# Patient Record
Sex: Male | Born: 1990 | Hispanic: No | Marital: Single | Smoking: Current every day smoker
Health system: Southern US, Community
[De-identification: ages and names within clinical notes are randomized; demographics above are authoritative.]

---

## 2016-09-19 ENCOUNTER — Encounter (HOSPITAL_COMMUNITY): Payer: Self-pay

## 2016-09-19 ENCOUNTER — Emergency Department (HOSPITAL_COMMUNITY)
Admission: EM | Admit: 2016-09-19 | Discharge: 2016-09-19 | Disposition: A | Discharge status: Home / Self Care | Payer: Self-pay | Attending: Emergency Medicine | Admitting: Emergency Medicine

## 2016-09-19 DIAGNOSIS — R0981 Nasal congestion: Secondary | ICD-10-CM | POA: Insufficient documentation

## 2016-09-19 DIAGNOSIS — R059 Cough, unspecified: Secondary | ICD-10-CM

## 2016-09-19 DIAGNOSIS — Z79899 Other long term (current) drug therapy: Secondary | ICD-10-CM | POA: Insufficient documentation

## 2016-09-19 DIAGNOSIS — R05 Cough: Secondary | ICD-10-CM | POA: Insufficient documentation

## 2016-09-19 DIAGNOSIS — F17200 Nicotine dependence, unspecified, uncomplicated: Secondary | ICD-10-CM | POA: Insufficient documentation

## 2016-09-19 MED ORDER — BENZONATATE 100 MG PO CAPS: 100.0000 mg | ORAL_CAPSULE | Freq: Three times a day (TID) | ORAL | 0 refills | Status: AC | PRN

## 2016-09-19 MED ORDER — FLUTICASONE PROPIONATE 50 MCG/ACT NA SUSP: 1.0000 | Freq: Every day | NASAL | 0 refills | Status: AC

## 2016-09-19 NOTE — Discharge Instructions (Signed)
Take Tessalon as needed for cough. Flonase spray for congestion. Continue taking Claritin. Take ibuprofen or Tylenol as needed. Follow up with primary care if symptoms persist. Return to the ED if concerning symptoms develop.

## 2016-09-19 NOTE — ED Notes (Signed)
Pt audibly wheezing. Reported to PA.

## 2016-09-19 NOTE — ED Triage Notes (Signed)
Patient complains of congestion, cold symptoms and sinus pressure x 1 week, NAD

## 2016-09-19 NOTE — ED Provider Notes (Signed)
[facility]-EMERGENCY DEPT Provider Note   CSN: [id number] Arrival date & time: 09/19/16  1002  By signing my name below, I, Linna DarnerRussell Turner, attest that this documentation has been prepared under the direction and in the presence of AetnaMina Iker Nuttall, PA-C. Electronically Signed: Linna Darnerussell Turner, Scribe. 09/19/16. 12:03 PM  History   Chief Complaint Chief Complaint  Patient presents with  . Nasal Congestion   The history is provided by the patient. No language interpreter was used.   HPI Comments: Kurt Moore is a 26 y.o. male who presents to the Emergency Department complaining of constant, unchanged nasal congestion for 4 days. He reports associated sinus pressure, rhinorrhea, and an intermittent productive cough with clear sputum. He has tried Tylenol, Benadryl, Claritin, and Sudafed with some improvement of his symptoms. Patient notes his symptoms are consistent with his h/o seasonal allergies. He denies ear pain, sore throat, chest pain, SOB, abdominal pain, nausea, vomiting, diarrhea, headaches, fevers, chills, or any other associated symptoms.  History reviewed. No pertinent past medical history.  There are no active problems to display for this patient.   History reviewed. No pertinent surgical history.     Home Medications    Prior to Admission medications   Medication Sig Start Date End Date Taking? Authorizing Provider  benzonatate (TESSALON) 100 MG capsule Take 1 capsule (100 mg total) by mouth 3 (three) times daily as needed for cough. 09/19/16   Shafer Swamy A, PA-C  fluticasone (FLONASE) 50 MCG/ACT nasal spray Place 1 spray into both nostrils daily. 09/19/16   Jeanie SewerFawze, Taffie Eckmann A, PA-C    Family History No family history on file.  Social History Social History  Substance Use Topics  . Smoking status: Current Every Day Smoker  . Smokeless tobacco: Never Used  . Alcohol use Not on file     Allergies   Penicillins   Review of Systems Review of Systems  Constitutional:  Negative for chills and fever.  HENT: Positive for congestion, rhinorrhea and sinus pressure. Negative for ear pain and sore throat.   Respiratory: Positive for cough. Negative for shortness of breath.   Cardiovascular: Negative for chest pain.  Gastrointestinal: Negative for abdominal pain, diarrhea, nausea and vomiting.  Genitourinary: Negative for dysuria and hematuria.  Neurological: Negative for headaches.   Physical Exam Updated Vital Signs BP 140/90 (BP Location: Right Arm)   Pulse 91   Temp 98.9 F (37.2 C)   Resp 20   SpO2 95%   Physical Exam  Constitutional: He is oriented to person, place, and time. He appears well-developed and well-nourished.  HENT:  Head: Normocephalic.  Right Ear: External ear normal.  Left Ear: External ear normal.  Mouth/Throat: Oropharynx is clear and moist.  no TTP of frontal or maxillary sinuses. TMs normal bilaterally. Nasal septum is midline with pale pink mucosa and clear discharge. Posterior pharynx is normal without erythema, tonsillar hypertrophy, exudates, uvular deviation. Post-nasal drip noted.   Eyes: Conjunctivae are normal. Right eye exhibits no discharge. Left eye exhibits no discharge. No scleral icterus.  Neck: Normal range of motion. Neck supple. No JVD present. No tracheal deviation present. No thyromegaly present.  Cardiovascular: Normal rate, regular rhythm, normal heart sounds and intact distal pulses.   Pulmonary/Chest: Effort normal and breath sounds normal. No respiratory distress. He has no wheezes. He exhibits no tenderness.  Abdominal: Soft. Bowel sounds are normal. He exhibits no distension. There is no tenderness.  Musculoskeletal: Normal range of motion.  Moves extremities spontaneously  Lymphadenopathy:    He  has no cervical adenopathy.  Neurological: He is alert and oriented to person, place, and time.  Fluent speech, no facial droop, normal gait  Skin: Skin is warm and dry. Capillary refill takes less than 2  seconds.  Psychiatric: He has a normal mood and affect. His behavior is normal.  Nursing note and vitals reviewed.  ED Treatments / Results  Labs (all labs ordered are listed, but only abnormal results are displayed) Labs Reviewed - No data to display  EKG  EKG Interpretation None       Radiology No results found.  Procedures Procedures (including critical care time)  DIAGNOSTIC STUDIES: Oxygen Saturation is 97% on RA, normal by my interpretation.    COORDINATION OF CARE: 12:08 PM Discussed treatment plan with pt at bedside and pt agreed to plan.  Medications Ordered in ED Medications - No data to display   Initial Impression / Assessment and Plan / ED Course  I have reviewed the triage vital signs and the nursing notes.  Pertinent labs & imaging results that were available during my care of the patient were reviewed by me and considered in my medical decision making (see chart for details).  Patient with 4 day history of nasal congestion and cough. Afebrile, vital signs stable. No audible wheezing or other adventitious sounds on auscultation of lungs. Low suspicion of pneumonia, bronchitis or other cardiopulmonary abnormality. Physical exam consistent with URI or seasonal allergies. Rx for Tessalon for cough given as well as Flonase for nasal congestion. Encourage patient to continue use of Claritin for symptomatic treatment as it has been working. Recurrent follow-up with primary care in the next few days for reevaluation. Discussed strict ED return precautions. Pt verbalized understanding of and agreement with plan and is safe for discharge home at this time.     Final Clinical Impressions(s) / ED Diagnoses   Final diagnoses:  Cough  Nasal congestion    New Prescriptions Discharge Medication List as of 09/19/2016 12:12 PM    START taking these medications   Details  benzonatate (TESSALON) 100 MG capsule Take 1 capsule (100 mg total) by mouth 3 (three) times daily  as needed for cough., Starting Sat 09/19/2016, Print    fluticasone (FLONASE) 50 MCG/ACT nasal spray Place 1 spray into both nostrils daily., Starting Sat 09/19/2016, Print       I personally performed the services described in this documentation, which was scribed in my presence. The recorded information has been reviewed and is accurate.    Jeanie Sewer, PA-C 09/20/16 1940    Arby Barrette, MD 09/21/16 [phone]

## 2016-09-19 NOTE — ED Notes (Signed)
Pt walked in hallway- pulse ox ranged from 100% to 93%.

## 2016-10-15 ENCOUNTER — Encounter (HOSPITAL_COMMUNITY): Payer: Self-pay

## 2016-10-15 ENCOUNTER — Emergency Department (HOSPITAL_COMMUNITY)
Admission: EM | Admit: 2016-10-15 | Discharge: 2016-10-15 | Disposition: A | Discharge status: Home / Self Care | Payer: Self-pay | Attending: Dermatology | Admitting: Dermatology

## 2016-10-15 DIAGNOSIS — Z5321 Procedure and treatment not carried out due to patient leaving prior to being seen by health care provider: Secondary | ICD-10-CM | POA: Insufficient documentation

## 2016-10-15 DIAGNOSIS — F17210 Nicotine dependence, cigarettes, uncomplicated: Secondary | ICD-10-CM | POA: Insufficient documentation

## 2016-10-15 DIAGNOSIS — Z79899 Other long term (current) drug therapy: Secondary | ICD-10-CM | POA: Insufficient documentation

## 2016-10-15 DIAGNOSIS — M25522 Pain in left elbow: Secondary | ICD-10-CM | POA: Insufficient documentation

## 2016-10-15 NOTE — ED Triage Notes (Signed)
Pt states that he was playing basketball on Sun and was hit in his L elbow, full ROM, no swelling noted

## 2016-10-16 ENCOUNTER — Encounter (HOSPITAL_COMMUNITY): Payer: Self-pay

## 2016-10-16 ENCOUNTER — Emergency Department (HOSPITAL_COMMUNITY): Payer: Self-pay

## 2016-10-16 ENCOUNTER — Emergency Department (HOSPITAL_COMMUNITY)
Admission: EM | Admit: 2016-10-16 | Discharge: 2016-10-16 | Disposition: A | Discharge status: Home / Self Care | Payer: Self-pay | Attending: Emergency Medicine | Admitting: Emergency Medicine

## 2016-10-16 DIAGNOSIS — Z79899 Other long term (current) drug therapy: Secondary | ICD-10-CM | POA: Insufficient documentation

## 2016-10-16 DIAGNOSIS — Y998 Other external cause status: Secondary | ICD-10-CM | POA: Insufficient documentation

## 2016-10-16 DIAGNOSIS — Y929 Unspecified place or not applicable: Secondary | ICD-10-CM | POA: Insufficient documentation

## 2016-10-16 DIAGNOSIS — S59902A Unspecified injury of left elbow, initial encounter: Secondary | ICD-10-CM | POA: Insufficient documentation

## 2016-10-16 DIAGNOSIS — F17200 Nicotine dependence, unspecified, uncomplicated: Secondary | ICD-10-CM | POA: Insufficient documentation

## 2016-10-16 DIAGNOSIS — X509XXA Other and unspecified overexertion or strenuous movements or postures, initial encounter: Secondary | ICD-10-CM | POA: Insufficient documentation

## 2016-10-16 DIAGNOSIS — Y9367 Activity, basketball: Secondary | ICD-10-CM | POA: Insufficient documentation

## 2016-10-16 MED ORDER — HYDROCODONE-ACETAMINOPHEN 5-325 MG PO TABS
1.0000 | ORAL_TABLET | Freq: Once | ORAL | Status: AC
  Administered 2016-10-16: 1 via ORAL
  Filled 2016-10-16: qty 1

## 2016-10-16 MED ORDER — NAPROXEN 500 MG PO TABS: 500.0000 mg | ORAL_TABLET | Freq: Two times a day (BID) | ORAL | 0 refills | Status: AC

## 2016-10-16 NOTE — ED Notes (Signed)
Pt verbalized understanding of d/c instructions and has no further questions. Pt stable and NAD. VSS.  

## 2016-10-16 NOTE — ED Provider Notes (Signed)
[facility]PT Provider Note   CSN: [id number] Arrival date & time: 10/16/16  0127  By signing my name below, I, Elder NegusRussell Johnston, attest that this documentation has been prepared under the direction and in the presence of Shania Bjelland, Canary Brimhristopher J, MD. Electronically Signed: Elder Negusussell Johnston, Scribe. 10/16/16. 1:57 AM.   History   Chief Complaint Chief Complaint  Patient presents with  . Elbow Pain    HPI Kurt Moore is a 26 y.o. male without chronic medical problems who presents to the ED following a fall. The patient states he was playing basketball 4 days ago when he fell. Landed on his outstretched L hand. Since then he has experienced constant pain over the L distal, posterior upper arm. He is able to range the L elbow. He presents tonight because he continues to experience pain. No changes in sensation or strength distally. Denies any other injuries following his fall.  The history is provided by the patient. No language interpreter was used.  Arm Injury   This is a new problem. The current episode started more than 2 days ago. The problem occurs constantly. The problem has not changed since onset.The pain is present in the left elbow. The pain is moderate. Pertinent negatives include no numbness, full range of motion and no tingling. He has tried nothing for the symptoms.    History reviewed. No pertinent past medical history.  There are no active problems to display for this patient.   History reviewed. No pertinent surgical history.     Home Medications    Prior to Admission medications   Medication Sig Start Date End Date Taking? Authorizing Provider  benzonatate (TESSALON) 100 MG capsule Take 1 capsule (100 mg total) by mouth 3 (three) times daily as needed for cough. 09/19/16   Fawze, Mina A, PA-C  fluticasone (FLONASE) 50 MCG/ACT nasal spray Place 1 spray into both nostrils daily. 09/19/16   Fawze, Mina A, PA-C  naproxen (NAPROSYN) 500 MG tablet Take 1 tablet  (500 mg total) by mouth 2 (two) times daily. 10/16/16   Gilda CreasePollina, Gilberte Gorley J, MD    Family History No family history on file.  Social History Social History  Substance Use Topics  . Smoking status: Current Every Day Smoker  . Smokeless tobacco: Never Used  . Alcohol use No     Allergies   Penicillins   Review of Systems Review of Systems  Musculoskeletal:       L arm pain per HPI  Neurological: Negative for tingling, weakness and numbness.  All other systems reviewed and are negative.    Physical Exam Updated Vital Signs BP 130/85   Pulse 66   Temp 98.2 F (36.8 C)   Resp 18   SpO2 94%   Physical Exam  Constitutional: He is oriented to person, place, and time. He appears well-developed and well-nourished. No distress.  HENT:  Head: Normocephalic and atraumatic.  Right Ear: Hearing normal.  Left Ear: Hearing normal.  Nose: Nose normal.  Mouth/Throat: Oropharynx is clear and moist and mucous membranes are normal.  Eyes: Conjunctivae and EOM are normal. Pupils are equal, round, and reactive to light.  Neck: Normal range of motion. Neck supple.  Cardiovascular: Regular rhythm, S1 normal and S2 normal.  Exam reveals no gallop and no friction rub.   No murmur heard. Pulmonary/Chest: Effort normal and breath sounds normal. No respiratory distress. He exhibits no tenderness.  Abdominal: Soft. Normal appearance and bowel sounds are normal. There is no hepatosplenomegaly. There is no  tenderness. There is no rebound, no guarding, no tenderness at McBurney's point and negative Murphy's sign. No hernia.  Musculoskeletal:  Able to fully range the L elbow. Reporting diffuse tenderness to the L elbow and distal L upper arm.  Neurological: He is alert and oriented to person, place, and time. He has normal strength. No cranial nerve deficit or sensory deficit. Coordination normal. GCS eye subscore is 4. GCS verbal subscore is 5. GCS motor subscore is 6.  Skin: Skin is warm, dry  and intact. No rash noted. No cyanosis.  Psychiatric: He has a normal mood and affect. His speech is normal and behavior is normal. Thought content normal.  Nursing note and vitals reviewed.    ED Treatments / Results  Labs (all labs ordered are listed, but only abnormal results are displayed) Labs Reviewed - No data to display  EKG  EKG Interpretation None       Radiology Dg Elbow Complete Left  Result Date: 10/16/2016 CLINICAL DATA:  Basketball injury, pain posteriorly EXAM: LEFT ELBOW - COMPLETE 3+ VIEW COMPARISON:  None. FINDINGS: There is no evidence of fracture, dislocation, or joint effusion. There is no evidence of arthropathy or other focal bone abnormality. Soft tissues are unremarkable. IMPRESSION: Negative. Electronically Signed   By: Jasmine Pang M.D.   On: 10/16/2016 02:30    Procedures Procedures (including critical care time)  Medications Ordered in ED Medications  HYDROcodone-acetaminophen (NORCO/VICODIN) 5-325 MG per tablet 1 tablet (1 tablet Oral Given 10/16/16 0207)     Initial Impression / Assessment and Plan / ED Course  I have reviewed the triage vital signs and the nursing notes.  Pertinent labs & imaging results that were available during my care of the patient were reviewed by me and considered in my medical decision making (see chart for details).     Patient with isolated left elbow injury. He reports falling on an outstretched arm all playing basketball several days ago. He has been having pain at the elbow since. No wrist pain. Examination reveals normal range of motion, mild tenderness without swelling or deformity. X-ray negative.  Final Clinical Impressions(s) / ED Diagnoses   Final diagnoses:  Injury of left elbow, initial encounter    New Prescriptions New Prescriptions   NAPROXEN (NAPROSYN) 500 MG TABLET    Take 1 tablet (500 mg total) by mouth 2 (two) times daily.  I personally performed the services described in this  documentation, which was scribed in my presence. The recorded information has been reviewed and is accurate.    Gilda Crease, MD 10/16/16 [phone]

## 2016-10-16 NOTE — ED Notes (Signed)
Patient mistakenly taken out of the system.  Due to time, patient needs to reregister and be triaged again.  Please do not charge patient for this visit.

## 2016-10-16 NOTE — ED Triage Notes (Signed)
Pt has L elbow pain for one week, full ROM, no swelling noted

## 2017-04-14 ENCOUNTER — Emergency Department (HOSPITAL_COMMUNITY)
Admission: EM | Admit: 2017-04-14 | Discharge: 2017-04-14 | Disposition: A | Discharge status: Home / Self Care | Payer: Self-pay | Attending: Emergency Medicine | Admitting: Emergency Medicine

## 2017-04-14 ENCOUNTER — Other Ambulatory Visit: Payer: Self-pay

## 2017-04-14 ENCOUNTER — Emergency Department (HOSPITAL_COMMUNITY): Payer: Self-pay

## 2017-04-14 ENCOUNTER — Encounter (HOSPITAL_COMMUNITY): Payer: Self-pay | Admitting: Emergency Medicine

## 2017-04-14 DIAGNOSIS — X509XXA Other and unspecified overexertion or strenuous movements or postures, initial encounter: Secondary | ICD-10-CM | POA: Insufficient documentation

## 2017-04-14 DIAGNOSIS — Y929 Unspecified place or not applicable: Secondary | ICD-10-CM | POA: Insufficient documentation

## 2017-04-14 DIAGNOSIS — S86002A Unspecified injury of left Achilles tendon, initial encounter: Secondary | ICD-10-CM | POA: Insufficient documentation

## 2017-04-14 DIAGNOSIS — F17210 Nicotine dependence, cigarettes, uncomplicated: Secondary | ICD-10-CM | POA: Insufficient documentation

## 2017-04-14 DIAGNOSIS — Y998 Other external cause status: Secondary | ICD-10-CM | POA: Insufficient documentation

## 2017-04-14 DIAGNOSIS — Z79899 Other long term (current) drug therapy: Secondary | ICD-10-CM | POA: Insufficient documentation

## 2017-04-14 DIAGNOSIS — Y9367 Activity, basketball: Secondary | ICD-10-CM | POA: Insufficient documentation

## 2017-04-14 MED ORDER — IBUPROFEN 400 MG PO TABS
600.0000 mg | ORAL_TABLET | Freq: Once | ORAL | Status: AC
  Administered 2017-04-14: 600 mg via ORAL
  Filled 2017-04-14: qty 1

## 2017-04-14 NOTE — ED Notes (Signed)
Patient transported to X-ray 

## 2017-04-14 NOTE — ED Provider Notes (Signed)
[facility] EMERGENCY DEPARTMENT Provider Note   CSN: [id number] Arrival date & time: 04/14/17  0750     History   Chief Complaint Chief Complaint  Patient presents with  . Ankle Pain    HPI Kurt Moore Hersman is a 26 y.o. male presents today for evaluation of left posterior ankle pain.  He reports that on Monday he was playing basketball when he feels like he rolled it.  Initially it did not hurt, however when he woke up Monday morning he had pain over his posterior ankle.  He denies any swelling.  He has not tried anything prior to arrival.  His pain is made worse with palpation and movement.  He denies any numbness or tingling.  Has been ambulatory since.  HPI  History reviewed. No pertinent past medical history.  There are no active problems to display for this patient.   History reviewed. No pertinent surgical history.     Home Medications    Prior to Admission medications   Medication Sig Start Date End Date Taking? Authorizing Provider  benzonatate (TESSALON) 100 MG capsule Take 1 capsule (100 mg total) by mouth 3 (three) times daily as needed for cough. 09/19/16   Fawze, Mina A, PA-C  fluticasone (FLONASE) 50 MCG/ACT nasal spray Place 1 spray into both nostrils daily. 09/19/16   Fawze, Mina A, PA-C  naproxen (NAPROSYN) 500 MG tablet Take 1 tablet (500 mg total) by mouth 2 (two) times daily. 10/16/16   Gilda CreasePollina, Christopher J, MD    Family History History reviewed. No pertinent family history.  Social History Social History   Tobacco Use  . Smoking status: Current Every Day Smoker  . Smokeless tobacco: Never Used  Substance Use Topics  . Alcohol use: No  . Drug use: No     Allergies   Penicillins   Review of Systems Review of Systems  Constitutional: Negative for chills and fever.  Musculoskeletal: Positive for arthralgias. Negative for joint swelling.  Skin: Negative for color change and wound.  Neurological: Negative for weakness.      Physical Exam Updated Vital Signs BP (!) 145/73 (BP Location: Right Arm)   Pulse 67   Temp 97.7 F (36.5 C) (Oral)   Resp 16   SpO2 97%   Physical Exam  Constitutional: He appears well-developed and well-nourished.  HENT:  Head: Normocephalic and atraumatic.  Cardiovascular:  2+ DP/PT pulses bilaterally  Musculoskeletal:  Tenderness to palpation along left Achilles tendon.  Thompson test is normal.  5/5 strength to left ankle flexion/extension although is painful.  No TTP over malleolus bilaterally or over the area anterior to the lateral malleolus.  No obvious deformities or crepitus.   Neurological:  Sensation intact to bilateral lower extremities.   Skin: Skin is warm and dry. He is not diaphoretic.  Nursing note and vitals reviewed.    ED Treatments / Results  Labs (all labs ordered are listed, but only abnormal results are displayed) Labs Reviewed - No data to display  EKG  EKG Interpretation None       Radiology Dg Ankle Complete Left  Result Date: 04/14/2017 CLINICAL DATA:  Injury playing basketball 2 days ago. Pain and swelling EXAM: LEFT ANKLE COMPLETE - 3+ VIEW COMPARISON:  None. FINDINGS: There is no evidence of fracture, dislocation, or joint effusion. There is no evidence of arthropathy or other focal bone abnormality. Soft tissues are unremarkable. IMPRESSION: Negative. Electronically Signed   By: Marlan Palauharles  Clark M.D.   On: 04/14/2017 09:41  Dg Foot Complete Left  Result Date: 04/14/2017 CLINICAL DATA:  Injury playing basketball 2 days ago. Pain and swelling EXAM: LEFT FOOT - COMPLETE 3+ VIEW COMPARISON:  None. FINDINGS: There is no evidence of fracture or dislocation. There is no evidence of arthropathy or other focal bone abnormality. Soft tissues are unremarkable. IMPRESSION: Negative. Electronically Signed   By: Marlan Palauharles  Clark M.D.   On: 04/14/2017 09:41    Procedures Procedures (including critical care time)  Medications Ordered in  ED Medications  ibuprofen (ADVIL,MOTRIN) tablet 600 mg (600 mg Oral Given 04/14/17 0909)     Initial Impression / Assessment and Plan / ED Course  I have reviewed the triage vital signs and the nursing notes.  Pertinent labs & imaging results that were available during my care of the patient were reviewed by me and considered in my medical decision making (see chart for details).    Kurt Moore Romanski presents today for evaluation of delayed onset pain over his left posterior ankle consistent with Achilles tendon.  Thompson test is normal.  He has intact strength, however this produces pain.  I do not suspect rupture due to delayed presentation, ambulatory, no obvious deformity and intact strength.   I suspect Achilles tendinitis.  He was given a ASO, instructions on over-the-counter pain management, work note, and follow-up with orthopedics as needed.  CAM walker was considered but due to cost pt chose ASO instead.     Final Clinical Impressions(s) / ED Diagnoses   Final diagnoses:  Injury of left Achilles tendon, initial encounter    ED Discharge Orders    None       Norman ClayHammond, Glanda Spanbauer W, PA-C 04/14/17 1041    Cathren LaineSteinl, Kevin, MD 04/14/17 1325

## 2017-04-14 NOTE — ED Triage Notes (Signed)
Pt here with left ankle pain after rolling it playing basketball Monday night , no swelling noted , pain mostly to the back of foot

## 2017-04-14 NOTE — Progress Notes (Signed)
Orthopedic Tech Progress Note Patient Details:  Kurt Moore Crehan 03/29/1991 [id number]  Ortho Devices Type of Ortho Device: ASO Ortho Device/Splint Location: lle Ortho Device/Splint Interventions: Application   Lennard Capek 04/14/2017, 10:34 AM

## 2017-04-14 NOTE — Discharge Instructions (Signed)
Please take Ibuprofen (Advil, motrin) and Tylenol (acetaminophen) to relieve your pain.  You may take up to 600 MG (3 pills) of normal strength ibuprofen every 8 hours as needed.  In between doses of ibuprofen you make take tylenol, up to 1,000 mg (two extra strength pills).  Do not take more than 3,000 mg tylenol in a 24 hour period.  Please check all medication labels as many medications such as pain and cold medications may contain tylenol.  Do not drink alcohol while taking these medications.  Do not take other NSAID'S while taking ibuprofen (such as aleve or naproxen).  Please take ibuprofen with food to decrease stomach upset.  If your symptoms do not get better in one week then please follow up with the orthopedic Dr.

## 2017-10-22 IMAGING — CR DG ELBOW COMPLETE 3+V*L*
4 series · 4 of 4 positions shown · non-contrast
Comparison: None.

CLINICAL DATA: Basketball injury, pain posteriorly

EXAM:
LEFT ELBOW - COMPLETE 3+ VIEW

[elbow ap]
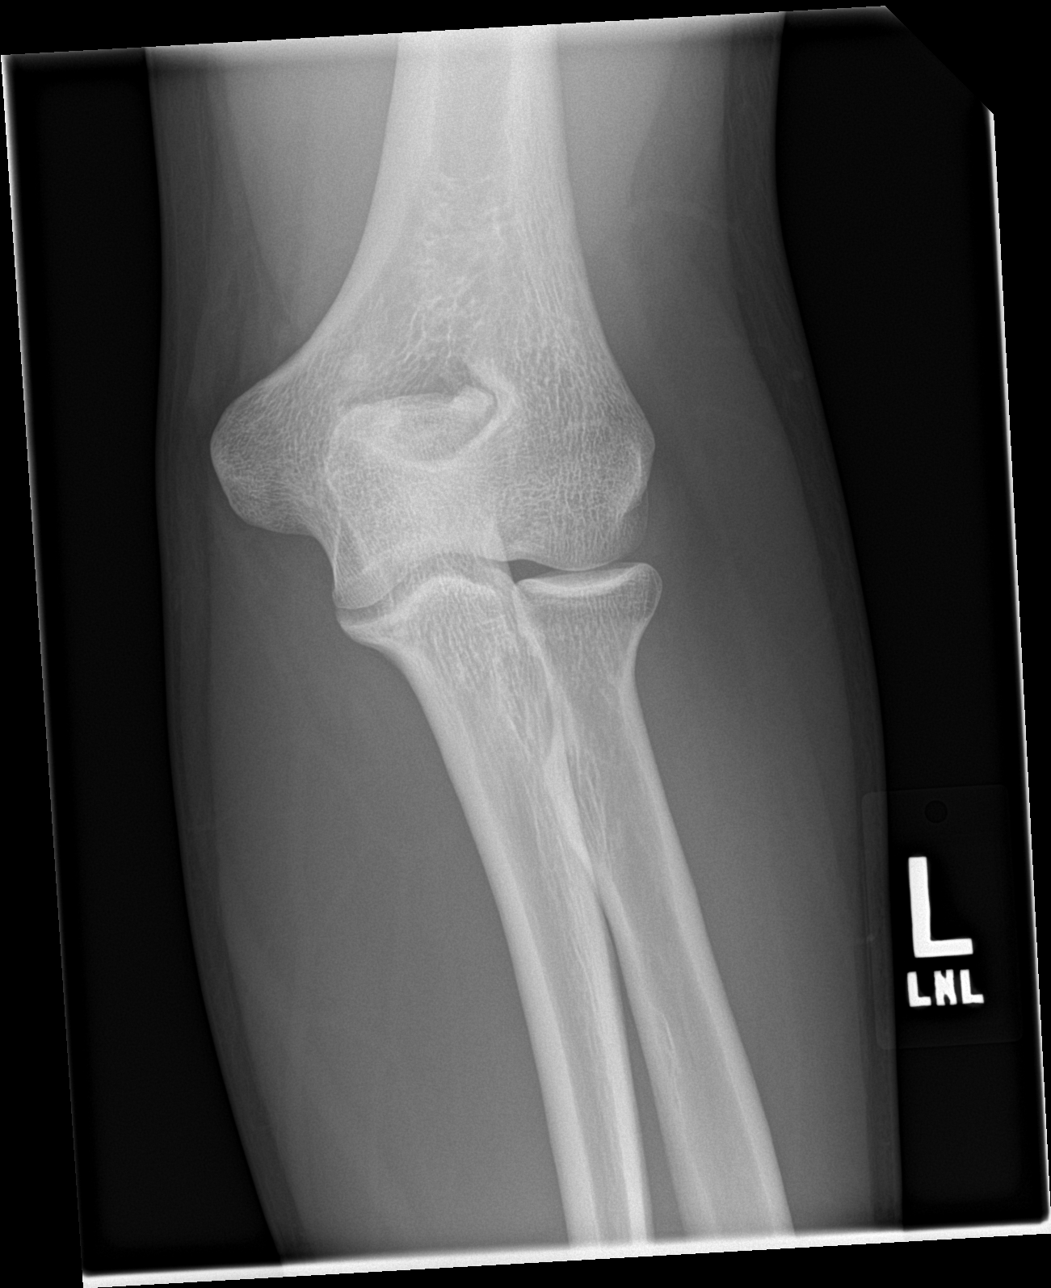

[elbow obl (1 of 2)]
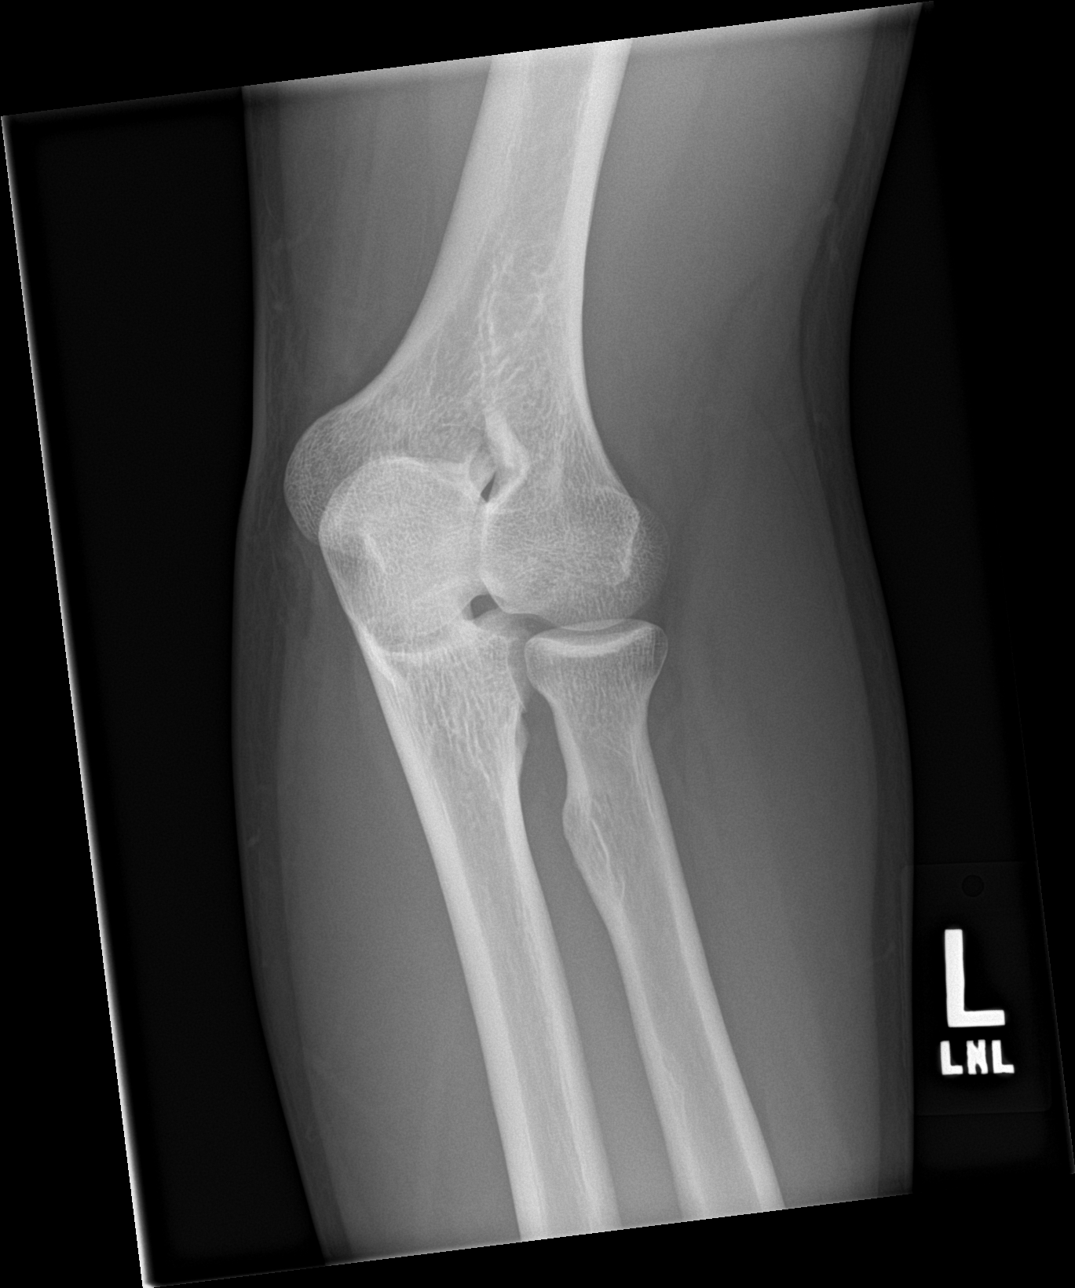

[elbow obl (2 of 2)]
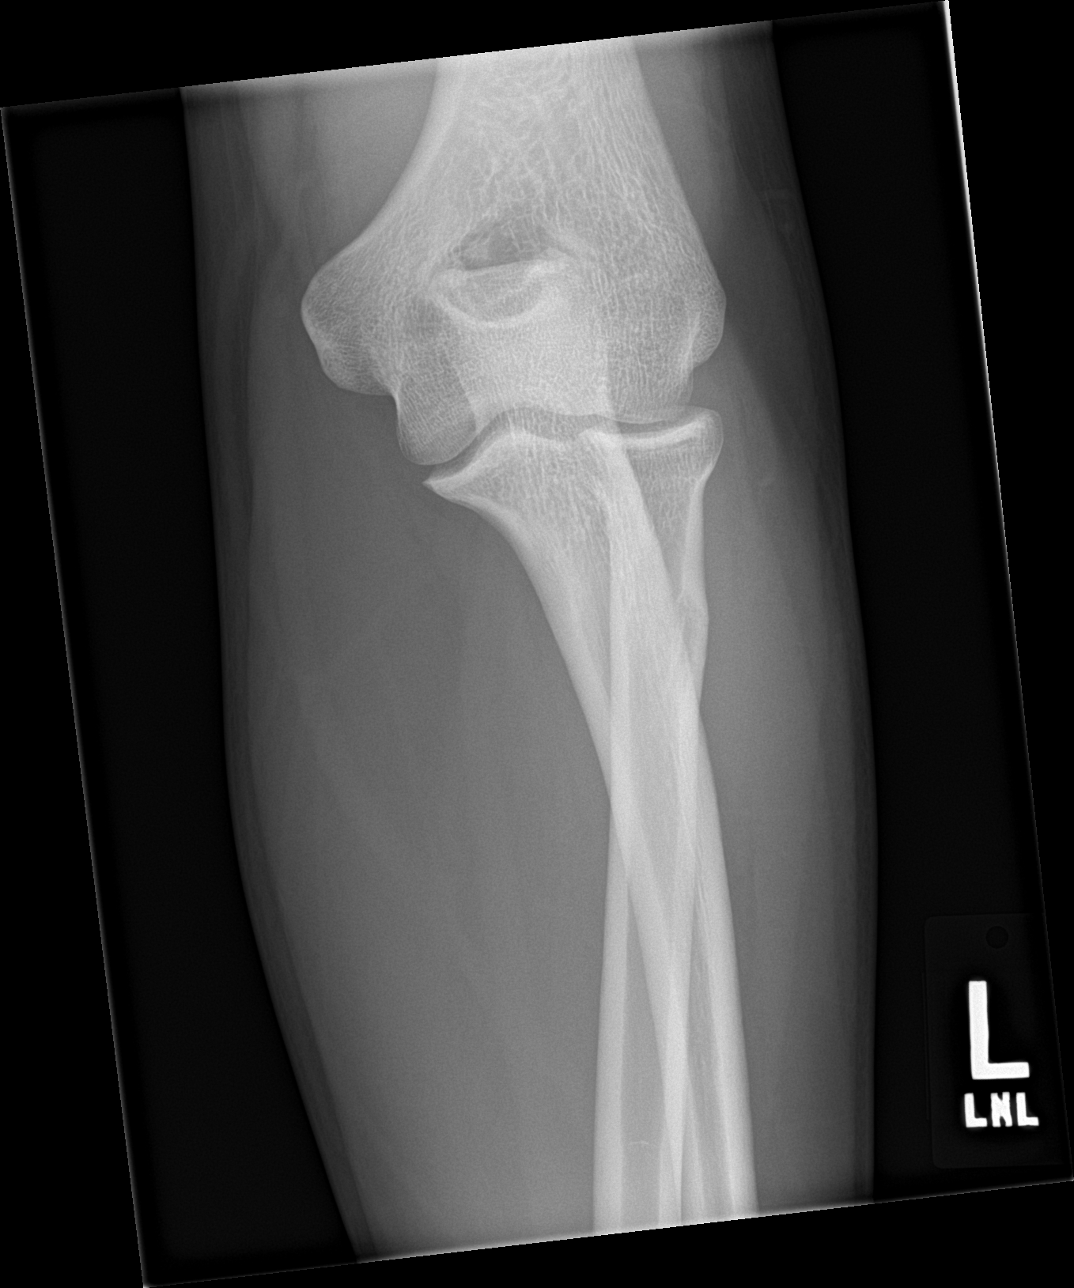

[elbow lat]
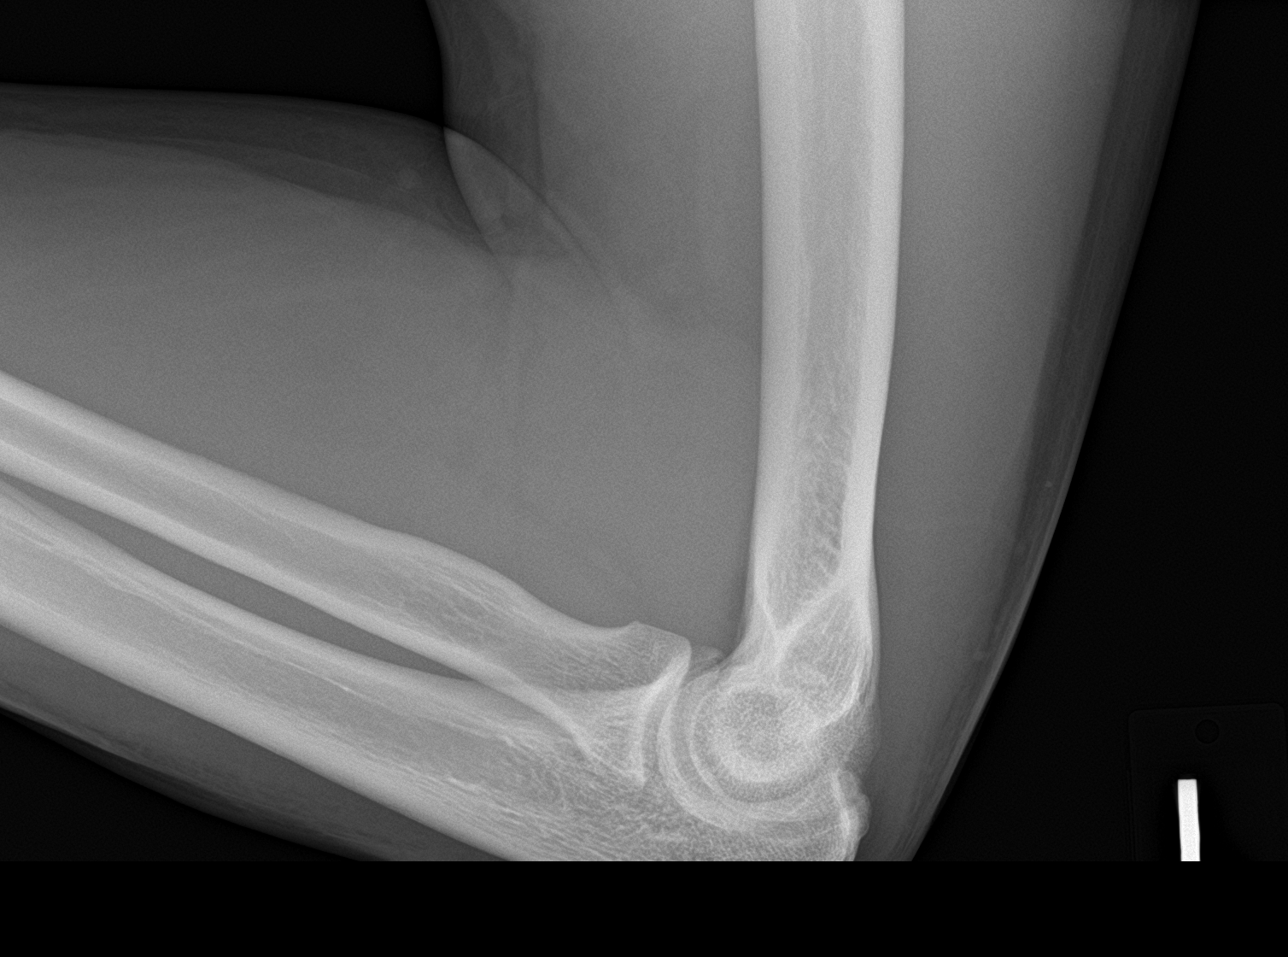

[4 of 4 positions shown; findings below may reference images not displayed]

FINDINGS: There is no evidence of fracture, dislocation, or joint effusion.
There is no evidence of arthropathy or other focal bone abnormality.
Soft tissues are unremarkable.
IMPRESSION: Negative.

## 2019-09-05 ENCOUNTER — Emergency Department (HOSPITAL_COMMUNITY)
Admission: EM | Admit: 2019-09-05 | Discharge: 2019-09-05 | Disposition: A | Discharge status: Home / Self Care | Payer: Self-pay | Attending: Emergency Medicine | Admitting: Emergency Medicine

## 2019-09-05 ENCOUNTER — Emergency Department (HOSPITAL_COMMUNITY): Payer: Self-pay

## 2019-09-05 ENCOUNTER — Encounter (HOSPITAL_COMMUNITY): Payer: Self-pay

## 2019-09-05 ENCOUNTER — Other Ambulatory Visit: Payer: Self-pay

## 2019-09-05 DIAGNOSIS — M542 Cervicalgia: Secondary | ICD-10-CM | POA: Insufficient documentation

## 2019-09-05 DIAGNOSIS — F17210 Nicotine dependence, cigarettes, uncomplicated: Secondary | ICD-10-CM | POA: Insufficient documentation

## 2019-09-05 DIAGNOSIS — Y93I9 Activity, other involving external motion: Secondary | ICD-10-CM | POA: Insufficient documentation

## 2019-09-05 DIAGNOSIS — R519 Headache, unspecified: Secondary | ICD-10-CM | POA: Diagnosis present

## 2019-09-05 DIAGNOSIS — Y92410 Unspecified street and highway as the place of occurrence of the external cause: Secondary | ICD-10-CM | POA: Diagnosis not present

## 2019-09-05 DIAGNOSIS — M79604 Pain in right leg: Secondary | ICD-10-CM | POA: Insufficient documentation

## 2019-09-05 DIAGNOSIS — Y999 Unspecified external cause status: Secondary | ICD-10-CM | POA: Insufficient documentation

## 2019-09-05 DIAGNOSIS — H6121 Impacted cerumen, right ear: Secondary | ICD-10-CM | POA: Diagnosis not present

## 2019-09-05 DIAGNOSIS — T148XXA Other injury of unspecified body region, initial encounter: Secondary | ICD-10-CM

## 2019-09-05 MED ORDER — METHOCARBAMOL 750 MG PO TABS: 750.0000 mg | ORAL_TABLET | Freq: Three times a day (TID) | ORAL | 0 refills | Status: AC | PRN

## 2019-09-05 MED ORDER — METHOCARBAMOL 500 MG PO TABS
750.0000 mg | ORAL_TABLET | Freq: Once | ORAL | Status: AC
  Administered 2019-09-05: 750 mg via ORAL
  Filled 2019-09-05: qty 2
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
## 2019-09-05 NOTE — ED Notes (Signed)
Patient verbalizes understanding of discharge instructions. Opportunity for questioning and answers were provided. Armband removed by staff, pt discharged from ED to home via POV  

## 2019-09-05 NOTE — Discharge Instructions (Addendum)
Your prescription for Robaxin also known as methocarbamol says to take 1 tablet, however if needed you may take 1.5 or 2 tablets at once.    Please take Ibuprofen (Advil, motrin) and Tylenol (acetaminophen) to relieve your pain.  You may take up to 600 MG (3 pills) of normal strength ibuprofen every 8 hours as needed.  In between doses of ibuprofen you make take tylenol, up to 1,000 mg (two extra strength pills).  Do not take more than 3,000 mg tylenol in a 24 hour period.  Please check all medication labels as many medications such as pain and cold medications may contain tylenol.  Do not drink alcohol while taking these medications.  Do not take other NSAID'S while taking ibuprofen (such as aleve or naproxen).  Please take ibuprofen with food to decrease stomach upset.  You are being prescribed a medication which may make you sleepy. For 24 hours after one dose please do not drive, operate heavy machinery, care for a small child with out another adult present, or perform any activities that may cause harm to you or someone else if you were to fall asleep or be impaired.

## 2021-04-02 ENCOUNTER — Other Ambulatory Visit: Payer: Self-pay | Admitting: Occupational Medicine

## 2021-04-02 ENCOUNTER — Other Ambulatory Visit: Payer: Self-pay

## 2021-04-02 ENCOUNTER — Ambulatory Visit: Payer: Self-pay

## 2021-04-02 DIAGNOSIS — M25532 Pain in left wrist: Secondary | ICD-10-CM

## 2022-07-14 ENCOUNTER — Ambulatory Visit: Payer: Self-pay

## 2022-07-14 ENCOUNTER — Other Ambulatory Visit: Payer: Self-pay | Admitting: Family Medicine

## 2022-07-14 DIAGNOSIS — M25511 Pain in right shoulder: Secondary | ICD-10-CM
